# Patient Record
Sex: Male | Born: 1980 | Race: White | Hispanic: No | Marital: Married | Smoking: Former smoker
Health system: Southern US, Community
[De-identification: ages and names within clinical notes are randomized; demographics above are authoritative.]

## PROBLEM LIST (undated history)

## (undated) DIAGNOSIS — N200 Calculus of kidney: Secondary | ICD-10-CM

## (undated) DIAGNOSIS — Z87442 Personal history of urinary calculi: Secondary | ICD-10-CM

## (undated) HISTORY — PX: ANKLE SURGERY: SHX546

## (undated) HISTORY — PX: CYSTOSCOPY WITH HOLMIUM LASER LITHOTRIPSY: SHX6639

---

## 2001-03-16 ENCOUNTER — Encounter: Payer: Self-pay | Admitting: *Deleted

## 2001-03-16 ENCOUNTER — Emergency Department (HOSPITAL_COMMUNITY): Admission: EM | Admit: 2001-03-16 | Discharge: 2001-03-16 | Payer: Self-pay | Admitting: *Deleted

## 2001-03-17 HISTORY — PX: ANKLE SURGERY: SHX546

## 2002-04-09 ENCOUNTER — Emergency Department (HOSPITAL_COMMUNITY): Admission: EM | Admit: 2002-04-09 | Discharge: 2002-04-10 | Payer: Self-pay | Admitting: Internal Medicine

## 2002-04-09 ENCOUNTER — Encounter: Payer: Self-pay | Admitting: Internal Medicine

## 2002-04-10 ENCOUNTER — Encounter: Payer: Self-pay | Admitting: Internal Medicine

## 2002-04-13 ENCOUNTER — Ambulatory Visit (HOSPITAL_COMMUNITY): Admission: RE | Admit: 2002-04-13 | Discharge: 2002-04-13 | Payer: Self-pay | Admitting: Orthopaedic Surgery

## 2002-04-13 ENCOUNTER — Encounter: Payer: Self-pay | Admitting: Orthopaedic Surgery

## 2005-06-22 ENCOUNTER — Emergency Department (HOSPITAL_COMMUNITY): Admission: EM | Admit: 2005-06-22 | Discharge: 2005-06-22 | Payer: Self-pay | Admitting: Emergency Medicine

## 2005-07-02 ENCOUNTER — Emergency Department (HOSPITAL_COMMUNITY): Admission: EM | Admit: 2005-07-02 | Discharge: 2005-07-02 | Payer: Self-pay | Admitting: Emergency Medicine

## 2005-07-03 ENCOUNTER — Ambulatory Visit (HOSPITAL_BASED_OUTPATIENT_CLINIC_OR_DEPARTMENT_OTHER): Admission: RE | Admit: 2005-07-03 | Discharge: 2005-07-03 | Payer: Self-pay | Admitting: Urology

## 2007-11-23 ENCOUNTER — Emergency Department (HOSPITAL_COMMUNITY): Admission: EM | Admit: 2007-11-23 | Discharge: 2007-11-23 | Payer: Self-pay | Admitting: Emergency Medicine

## 2008-12-16 ENCOUNTER — Emergency Department (HOSPITAL_COMMUNITY): Admission: EM | Admit: 2008-12-16 | Discharge: 2008-12-16 | Payer: Self-pay | Admitting: Emergency Medicine

## 2009-03-09 ENCOUNTER — Emergency Department (HOSPITAL_COMMUNITY): Admission: EM | Admit: 2009-03-09 | Discharge: 2009-03-09 | Payer: Self-pay | Admitting: Emergency Medicine

## 2009-05-08 ENCOUNTER — Ambulatory Visit (HOSPITAL_COMMUNITY): Payer: Self-pay | Admitting: Psychiatry

## 2009-06-19 ENCOUNTER — Ambulatory Visit (HOSPITAL_COMMUNITY): Payer: Self-pay | Admitting: Psychiatry

## 2009-06-29 ENCOUNTER — Ambulatory Visit (HOSPITAL_COMMUNITY): Payer: Self-pay | Admitting: Psychology

## 2009-07-04 ENCOUNTER — Ambulatory Visit (HOSPITAL_COMMUNITY): Payer: Self-pay | Admitting: Psychology

## 2009-07-19 ENCOUNTER — Ambulatory Visit (HOSPITAL_COMMUNITY): Payer: Self-pay | Admitting: Psychology

## 2009-08-02 ENCOUNTER — Ambulatory Visit (HOSPITAL_COMMUNITY): Admission: RE | Admit: 2009-08-02 | Discharge: 2009-08-02 | Payer: Self-pay | Admitting: Urology

## 2010-03-17 HISTORY — PX: EXTRACORPOREAL SHOCK WAVE LITHOTRIPSY: SHX1557

## 2010-06-17 LAB — URINALYSIS, ROUTINE W REFLEX MICROSCOPIC
Nitrite: NEGATIVE
Specific Gravity, Urine: 1.005 (ref 1.005–1.030)

## 2010-06-20 LAB — URINALYSIS, ROUTINE W REFLEX MICROSCOPIC
Bilirubin Urine: NEGATIVE
Glucose, UA: NEGATIVE mg/dL
Ketones, ur: NEGATIVE mg/dL
Leukocytes, UA: NEGATIVE
Nitrite: NEGATIVE
Specific Gravity, Urine: 1.02 (ref 1.005–1.030)
Urobilinogen, UA: 0.2 mg/dL (ref 0.0–1.0)
pH: 7 (ref 5.0–8.0)

## 2010-06-20 LAB — URINE MICROSCOPIC-ADD ON

## 2010-08-02 NOTE — Op Note (Signed)
NAME:  Joseph Patel, Joseph Patel                  ACCOUNT NO.:  [id number]   MEDICAL RECORD NO.:  [id number]          PATIENT TYPE:  AMB   LOCATION:  [facility]                         FACILITY:  [facility]   PHYSICIAN:  Ronald L. Earlene Plater, M.D.  DATE OF BIRTH:  11-Mar-1981   DATE OF PROCEDURE:  07/03/2005  DATE OF DISCHARGE:                                 OPERATIVE REPORT   PREOPERATIVE DIAGNOSIS:  Left ureteral lithiasis.   POSTOPERATIVE DIAGNOSIS:  Left ureteral lithiasis.   PROCEDURE:  Left ureteroscopy with holmium laser lithotripsy, basket stone  extraction, and placement of double-J stent.   SURGEON:  Lucrezia Starch. Earlene Plater, M.D.   ANESTHESIA:  LMA.   ESTIMATED BLOOD LOSS:  Negligible.   DRAINS:  26-cm 6 French Contour double pigtail stent.   COMPLICATIONS:  None.   INDICATIONS FOR PROCEDURE:  [name] is a very nice 30 year old white male  who presented with left flank pain and some nausea.  He was seen in the  office and found on CT scan to have approximately a 7-mm left lower ureteral  calculus with hydronephrosis.  He has had recurrent symptoms.  The stone  appears to not be moving.  He has had to go to the emergency room a  secondary time.  After understanding the risks, benefits, and alternatives,  he has elected to proceed with the above procedure.   DESCRIPTION OF PROCEDURE:  The patient was placed in the supine position.  After appropriate LMA anesthesia, he was placed in the dorsal lithotomy  position and prepped and draped in the usual sterile fashion.   Cystourethroscopy was performed with a 22.5 [place] Olympus panendoscope.  Utilizing the 12 and 70-degree lenses, the bladder was carefully inspected.  It was noted to be without lesions.  There was no significant trilobar  hypertrophy, and the orifice was somewhat bulging but did not appear to be  inflamed.  Initial attempts at passing a center wire which was 0.38 [place]  were unsuccessful; therefore, a 6 [place] open-ended catheter was  passed, and  the wire was able to be passed beyond it.  The open-ended catheter was  passed beyond the stone.  Utilizing the wire, the dilating core of a short  ureteral access sheath was placed and dilated the tip just beyond the stone,  but the entire dilator could not be passed past the stone due to the fact  that it was highly impacted.  This was removed.  Ureteroscopy was then  performed with a short thin ureteroscope.  The stone was visualized and  noted to be highly impacted.  Utilizing a 365 Micron laser fiber, it was  broken into multiple fragments and serially extracted with a nitinol basket.  Inspection of the lower third of the ureter revealed that there were no  significant fragments, and no perforations were noted.  The ureteroscope was  removed.  The stone fragments will be submitted for stone analysis.  Under  fluoroscopic guidance, a 26-cm 6 French Contour double pigtail stent was  placed with a pull out string and  noted to be in good position  within the left renal pelvis within the  bladder.  The bladder was drained.  The stent location was confirmed  fluoroscopically.   The patient was taken to the recovery room in stable condition.      Ronald L. Earlene Plater, M.D.  Electronically Signed     RLD/MEDQ  D:  07/03/2005  T:  07/04/2005  Job:  [id number]

## 2010-12-18 LAB — URINALYSIS, ROUTINE W REFLEX MICROSCOPIC

## 2010-12-18 LAB — URINE MICROSCOPIC-ADD ON

## 2011-02-26 ENCOUNTER — Emergency Department (HOSPITAL_COMMUNITY): Payer: No Typology Code available for payment source

## 2011-02-26 ENCOUNTER — Emergency Department (HOSPITAL_COMMUNITY)
Admission: EM | Admit: 2011-02-26 | Discharge: 2011-02-26 | Disposition: A | Discharge status: Home / Self Care | Payer: No Typology Code available for payment source | Attending: Emergency Medicine | Admitting: Emergency Medicine

## 2011-02-26 ENCOUNTER — Encounter: Payer: Self-pay | Admitting: Emergency Medicine

## 2011-02-26 DIAGNOSIS — M25579 Pain in unspecified ankle and joints of unspecified foot: Secondary | ICD-10-CM | POA: Insufficient documentation

## 2011-02-26 DIAGNOSIS — M542 Cervicalgia: Secondary | ICD-10-CM | POA: Insufficient documentation

## 2011-02-26 HISTORY — DX: Calculus of kidney: N20.0
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
## 2011-10-16 ENCOUNTER — Other Ambulatory Visit: Payer: Self-pay | Admitting: Urology

## 2011-10-20 ENCOUNTER — Encounter (HOSPITAL_COMMUNITY): Payer: Self-pay | Admitting: *Deleted

## 2011-10-20 NOTE — Progress Notes (Signed)
1125 Patient was able to repeat back all pre op  instructions for his lithotripsy procedure including arrival time.  His knows not to take Advil, Aleve,Aspirin or Toradol 72 hours prior to his procedure, Bring insurance card,ID, take a laxative and eat a light dinner 10-22-11 by 6 pm.

## 2011-10-22 ENCOUNTER — Encounter (HOSPITAL_COMMUNITY): Payer: Self-pay | Admitting: Pharmacy Technician

## 2011-10-22 DIAGNOSIS — N200 Calculus of kidney: Secondary | ICD-10-CM | POA: Diagnosis present

## 2011-10-22 NOTE — H&P (Signed)
History of Present Illness     Nephrolithiasis: He has a long history of renal calculi. A CT scan in 1/06 revealed multiple bilateral renal stones. In 4/07 he had a 8 mm left ureteral stone treated with ureteroscopy and laser lithotripsy. In 5/11 he underwent ESL of a left UPJ stone. He is intermittently past stones spontaneously. A CT scan done in 7/12 revealed bilateral, nonobstructing renal calculi with 7 x 8 mm stone in the left renal pelvis with Hounsfield units of approximately 850. He reports that on average he passes a stone about every 1-1/2 months. He said his past about 100 in his lifetime. Stone analysis 50% calcium phosphate and 50% calcium oxalate Metabolic analysis: His serum studies were entirely normal. His 24-hour urine revealed saturation with calcium phosphate and uric acid due to a low volume which was found to be only 650 cc/24 hours.  Interval HX: He is asymptomatic today.   Past Medical History Problems  1. History of  Hydronephrosis On The Right 591 2. History of  Nephrolithiasis V13.01 3. History of  Proximal Ureteral Stone On The Left 592.1  Surgical History Problems  1. History of  Ankle Surgery 2. History of  Lithotripsy 3. History of  Lithotripsy  Current Meds 1. Hydrocodone-Acetaminophen 7.5-325 MG Oral Tablet; TAKE 1 TO 2 TABLETS EVERY 4 TO 6  HOURS AS NEEDED FOR PAIN; Therapy: 31Jul2013 to (Evaluate:04Aug2013); Last  Rx:31Jul2013 2. Tamsulosin HCl 0.4 MG Oral Capsule; TAKE 1 CAPSULE BY MOUTH  DAILY; Therapy: 31Jul2013  to (Last Rx:31Jul2013)  Requested for: 31Jul2013 3. Tylenol Extra Strength TABS; Therapy: (Recorded:31Jul2013) to 4. Vicodin TABS; Therapy: (Recorded:31Jul2013) to 5. Zyrtec TABS; Therapy: (Recorded:31Jul2013) to  Allergies Medication  1. No Known Drug Allergies  Family History Problems  1. Family history of  Family Health Status Number Of Children 1 daughter  Social History Problems  1. Caffeine Use occ. cola 2. Marital  History - Currently Married 3. Occupation: [employer] Denied  4. History of  Alcohol Use 5. History of  Tobacco Use  Review of Systems Genitourinary, constitutional, skin, eye, otolaryngeal, hematologic/lymphatic, cardiovascular, pulmonary, endocrine, musculoskeletal, gastrointestinal, neurological and psychiatric system(s) were reviewed and pertinent findings if present are noted.  Genitourinary: hematuria.  Gastrointestinal: flank pain.    Vitals Vital Signs Blood Pressure  Blood Pressure: 130 / 78 Temperature  Temperature: 98.6 F Pulse  Heart Rate: 92  Physical Exam Constitutional: Well nourished and well developed. No acute distress. The patient appears well hydrated.  ENT:. The oropharynx is normal.  Neck: The appearance of the neck is normal and no neck mass is present.  Pulmonary: No respiratory distress and clear bilateral breath sounds.  Cardiovascular: Heart rate and rhythm are normal. The arterial pulses are normal.  Abdomen: The abdomen is flat. The abdomen is soft and nontender. No suprapubic tenderness. No right CVA tenderness and mild left CVA tenderness. Bowel sounds are normal.  Rectal: The prostate exam was deferred.  Skin: Normal skin turgor and normal skin color and pigmentation.  Neuro/Psych:. Mood and affect are appropriate.   Assessment Assessed  1. Nephrolithiasis Of Both Kidneys 592.0       He has stones in both kidneys that are nonobstructing but there is a stone in the renal pelvis on the left-hand side that is likely the cause of his symptoms and we therefore have discussed treatment options. I discussed ureteroscopy, lithotripsy and percutaneous nephrolithotomy. The stone can be visualized on plain film and therefore would be amenable to lithotripsy. He is  most interested in this form of therapy and I therefore went over it with him in detail. We discussed the risks and complications as well as anticipated probability of success. He understands,  I answered all his questions, and he has elected to proceed with lithotripsy.  When he returns following his lithotripsy he needs to undergo a repeat evaluation with a hypercalciuria profile as well as 24-hour urine for stone risk. He then needs to be seen back to go over those results and begin some form of treatment. I told her he would likely need to be on some form of medication and he is more than willing to do that to help prevent further stone formation.   Plan     He will be scheduled for lithotripsy of his left renal pelvic calculus.

## 2011-10-23 ENCOUNTER — Ambulatory Visit (HOSPITAL_COMMUNITY): Payer: 59

## 2011-10-23 ENCOUNTER — Encounter (HOSPITAL_COMMUNITY)
Admission: RE | Disposition: A | Discharge status: Home / Self Care | Payer: Self-pay | Source: Ambulatory Visit | Attending: Urology

## 2011-10-23 ENCOUNTER — Ambulatory Visit (HOSPITAL_COMMUNITY)
Admission: RE | Admit: 2011-10-23 | Discharge: 2011-10-23 | Disposition: A | Discharge status: Home / Self Care | Payer: 59 | Source: Ambulatory Visit | Attending: Urology | Admitting: Urology

## 2011-10-23 ENCOUNTER — Encounter (HOSPITAL_COMMUNITY): Payer: Self-pay | Admitting: *Deleted

## 2011-10-23 DIAGNOSIS — N200 Calculus of kidney: Secondary | ICD-10-CM | POA: Insufficient documentation

## 2011-10-23 SURGERY — LITHOTRIPSY, ESWL
Anesthesia: LOCAL | Laterality: Left

## 2011-10-23 MED ORDER — SODIUM CHLORIDE 0.9 % IV SOLN
INTRAVENOUS | Status: DC
  Administered 2011-10-23: 1000 mL via INTRAVENOUS
  Administered 2011-10-23: 17:00:00 via INTRAVENOUS

## 2011-10-23 MED ORDER — DIPHENHYDRAMINE HCL 25 MG PO CAPS
25.0000 mg | ORAL_CAPSULE | ORAL | Status: AC
  Administered 2011-10-23: 25 mg via ORAL
  Filled 2011-10-23: qty 1

## 2011-10-23 MED ORDER — DIAZEPAM 5 MG PO TABS
10.0000 mg | ORAL_TABLET | ORAL | Status: AC
  Administered 2011-10-23: 10 mg via ORAL
  Filled 2011-10-23: qty 2

## 2011-10-23 MED ORDER — CIPROFLOXACIN HCL 500 MG PO TABS
500.0000 mg | ORAL_TABLET | ORAL | Status: AC
  Administered 2011-10-23: 500 mg via ORAL
  Filled 2011-10-23: qty 1

## 2011-10-23 MED ORDER — HYDROCODONE-ACETAMINOPHEN 7.5-325 MG PO TABS
1.0000 | ORAL_TABLET | Freq: Four times a day (QID) | ORAL | Status: DC | PRN
  Administered 2011-10-23: 1 via ORAL
  Filled 2011-10-23: qty 1

## 2011-10-23 MED ORDER — TAMSULOSIN HCL 0.4 MG PO CAPS
0.4000 mg | ORAL_CAPSULE | Freq: Once | ORAL | Status: DC
  Filled 2011-10-23: qty 1

## 2011-10-23 NOTE — Op Note (Signed)
See [facility] OP note scanned into chart. 

## 2011-10-23 NOTE — Interval H&P Note (Signed)
History and Physical Interval Note:  10/23/2011 3:20 PM  Joseph Patel  has presented today for surgery, with the diagnosis of Left renal Stone  The various methods of treatment have been discussed with the patient and family. After consideration of risks, benefits and other options for treatment, the patient has consented to  Procedure(s) (LRB): EXTRACORPOREAL SHOCK WAVE LITHOTRIPSY (ESWL) (Left) as a surgical intervention .  The patient's history has been reviewed, patient examined, no change in status, stable for surgery.  I have reviewed the patient's chart and labs.  Questions were answered to the patient's satisfaction.     [place]

## 2014-04-05 ENCOUNTER — Other Ambulatory Visit: Payer: Self-pay | Admitting: Urology

## 2014-04-06 ENCOUNTER — Encounter (HOSPITAL_COMMUNITY): Payer: Self-pay | Admitting: *Deleted

## 2014-04-10 ENCOUNTER — Ambulatory Visit (HOSPITAL_COMMUNITY): Payer: 59

## 2014-04-10 ENCOUNTER — Ambulatory Visit (HOSPITAL_COMMUNITY)
Admission: RE | Admit: 2014-04-10 | Discharge: 2014-04-10 | Disposition: A | Discharge status: Home / Self Care | Payer: 59 | Source: Ambulatory Visit | Attending: Urology | Admitting: Urology

## 2014-04-10 ENCOUNTER — Encounter (HOSPITAL_COMMUNITY): Payer: Self-pay | Admitting: *Deleted

## 2014-04-10 ENCOUNTER — Encounter (HOSPITAL_COMMUNITY)
Admission: RE | Disposition: A | Discharge status: Home / Self Care | Payer: Self-pay | Source: Ambulatory Visit | Attending: Urology

## 2014-04-10 DIAGNOSIS — N202 Calculus of kidney with calculus of ureter: Secondary | ICD-10-CM | POA: Insufficient documentation

## 2014-04-10 DIAGNOSIS — N200 Calculus of kidney: Secondary | ICD-10-CM

## 2014-04-10 SURGERY — LITHOTRIPSY, ESWL
Anesthesia: LOCAL | Laterality: Left

## 2014-04-10 MED ORDER — DIPHENHYDRAMINE HCL 25 MG PO CAPS
25.0000 mg | ORAL_CAPSULE | ORAL | Status: AC
  Administered 2014-04-10: 25 mg via ORAL
  Filled 2014-04-10: qty 1

## 2014-04-10 MED ORDER — TAMSULOSIN HCL 0.4 MG PO CAPS: 0.4000 mg | ORAL_CAPSULE | ORAL | Status: DC

## 2014-04-10 MED ORDER — SODIUM CHLORIDE 0.9 % IV SOLN
INTRAVENOUS | Status: DC
  Administered 2014-04-10: 1000 mL via INTRAVENOUS

## 2014-04-10 MED ORDER — DIAZEPAM 5 MG PO TABS
10.0000 mg | ORAL_TABLET | ORAL | Status: AC
Start: 2014-04-10 — End: 2014-04-10
  Administered 2014-04-10: 10 mg via ORAL
  Filled 2014-04-10: qty 2

## 2014-04-10 MED ORDER — OXYCODONE HCL 10 MG PO TABS: 10.0000 mg | ORAL_TABLET | ORAL | Status: DC | PRN

## 2014-04-10 MED ORDER — CIPROFLOXACIN HCL 500 MG PO TABS
500.0000 mg | ORAL_TABLET | ORAL | Status: AC
Start: 2014-04-10 — End: 2014-04-10
  Administered 2014-04-10: 500 mg via ORAL
  Filled 2014-04-10: qty 1

## 2014-04-10 NOTE — Discharge Instructions (Signed)

## 2014-04-10 NOTE — H&P (Signed)
Joseph Patel is a 34 year old male with calculus disease.   History of Present Illness Nephrolithiasis: He has a long history of renal calculi. A CT scan in 1/06 revealed multiple bilateral renal stones.   In 4/07 he had a 8 mm left ureteral stone treated with ureteroscopy and laser lithotripsy.   In 5/11 he underwent ESL of a left UPJ stone. He is intermittently past stones spontaneously.   A CT scan done in 7/12 revealed bilateral, nonobstructing renal calculi with 7 x 8 mm stone in the left renal pelvis with Hounsfield units of approximately 850. He reports that on average he passes a stone about every 1-1/2 months. He said his past about 100 in his lifetime.   He had ESL of a left renal pelvic stone in 8/13 and due to the presence of a stone in the lower pole that was in the shock path this stone was fragmented as well.    Stone analysis: 50% calcium phosphate and 50% calcium oxalate repeat stone analysis revealed composition of 95% calcium oxalate dihydrate and 5% calcium oxalate monohydrate.    Metabolic analysis: His serum studies were entirely normal.   His 24-hour urine in 5/07 revealed saturation with calcium phosphate and uric acid due to a low volume which was found to be only 650 cc/24 hours.  A repeat 24-hour urine study in 10/13 revealed supersaturation of calcium phosphate and calcium oxalate and a low urine volume of 1.69 L. Serum studies were normal.    Interval HX: He experienced left flank pain and was found by CT scan to have a nonobstructing 8.6 mm stone within the left renal pelvis with Hounsfield units of 1300 as well as a 4 mm stone in the left mid ureter. He was started on medical expulsive therapy and he eventually passed that stone. That was in 9/15 and now he tells me that he is been having pain in his left flank that has been associated with nausea but no vomiting. He has not had any hematuria. He did pass a small stone about 3 weeks ago as well.  Review of  Systems Genitourinary, constitutional, skin, eye, otolaryngeal, hematologic/lymphatic, cardiovascular, pulmonary, endocrine, musculoskeletal, gastrointestinal, neurological and psychiatric system(s) were reviewed and pertinent findings if present are noted and are otherwise negative.      Vitals Vital Signs  Height: 5 ft 11 in Weight: 182 lb  BMI Calculated: 25.38 BSA Calculated: 2.03 Blood Pressure: 124 / 84 Heart Rate: 76  Physical Exam Constitutional: Well nourished and well developed . No acute distress.   ENT:. The ears and nose are normal in appearance.   Neck: The appearance of the neck is normal and no neck mass is present.   Pulmonary: No respiratory distress and normal respiratory rhythm and effort.   Cardiovascular: Heart rate and rhythm are normal . No peripheral edema.   Abdomen: The abdomen is soft and nontender. No masses are palpated. No CVA tenderness. No hernias are palpable. No hepatosplenomegaly noted.   Lymphatics: The femoral and inguinal nodes are not enlarged or tender.   Skin: Normal skin turgor, no visible rash and no visible skin lesions.   Neuro/Psych:. Mood and affect are appropriate.       Past Medical History Problems  1. History of kidney stones (Z87.442) 2. History of Hydronephrosis, right (N13.30)  Surgical History Problems  1. History of Ankle Surgery 2. History of Lithotripsy 3. History of Lithotripsy 4. History of Lithotripsy  Current Meds 1. Oxycodone-Acetaminophen 10-325 MG Oral Tablet; TAKE  1 TO 2 TABLETS EVERY 4 TO  6 HOURS AS NEEDED FOR PAIN;  Therapy: 04Aug2015 to (Evaluate:07Aug2015); Last Rx:04Aug2015 Ordered 2. Tamsulosin HCl - 0.4 MG Oral Capsule; Take 1 capsule by mouth at bedtime;  Therapy: 04Aug2015 to (Complete:03Sep2015)  Requested for: 04Aug2015; Last  Rx:04Aug2015 Ordered 3. Tylenol Extra Strength TABS; TAKE TABLET  PRN;  Therapy: (Recorded:04Aug2015) to Recorded  Allergies Medication  1. No Known Drug  Allergies  Family History Problems  1. Family history of Family Health Status Number Of Children   1 daughter 2. No pertinent family history : Mother  Social History Problems  1. Denied: History of Alcohol Use 2. Caffeine Use   occ. cola 3. Marital History - Currently Married 4. Never A Smoker 5. Occupation:   [employer] 6. Denied: History of Tobacco Use      Assessment Assessed    He had been having left flank pain and therefore a KUB was obtained which reveals the stone in the lower pole of his left kidney appears unchanged but there appears to be a second stone that has developed more toward the area renal pelvis but still in the lower pole. There was also a nonobstructing right renal calculus noted which is unchanged.    We first discussed the fact that he has demonstrated metabolic stone activity and is now having pain in the left flank. He has undergone lithotripsy previously and we discussed proceeding with repeat lithotripsy of the stone as it has been successful for him in the past. We discussed the probability of success, the risks and complications, the outpatient nature of the procedure as well as the anticipated postoperative course. He understands and has elected to proceed.    Since he has continued to develop stones I have recommended beginning indapamide. We discussed the mechanism of action as well as the need to avoid a diet high in sodium.   Plan   He will be scheduled for lithotripsy of his left renal calculus.

## 2014-04-10 NOTE — Op Note (Signed)
See [facility] OP note scanned into chart. Also because of the size, density, location and other factors that cannot be anticipated I feel this will likely be a staged procedure. This fact supersedes any indication in the scanned [facility] operative note to the contrary.  

## 2014-05-16 ENCOUNTER — Other Ambulatory Visit (HOSPITAL_COMMUNITY): Payer: Self-pay | Admitting: Physician Assistant

## 2014-05-16 ENCOUNTER — Ambulatory Visit (HOSPITAL_COMMUNITY)
Admission: RE | Admit: 2014-05-16 | Discharge: 2014-05-16 | Disposition: A | Discharge status: Home / Self Care | Payer: 59 | Source: Ambulatory Visit | Attending: Physician Assistant | Admitting: Physician Assistant

## 2014-05-16 DIAGNOSIS — R0781 Pleurodynia: Secondary | ICD-10-CM | POA: Insufficient documentation

## 2014-05-16 DIAGNOSIS — R079 Chest pain, unspecified: Secondary | ICD-10-CM | POA: Diagnosis present

## 2016-04-17 DIAGNOSIS — Z6823 Body mass index (BMI) 23.0-23.9, adult: Secondary | ICD-10-CM | POA: Diagnosis not present

## 2016-04-17 DIAGNOSIS — J069 Acute upper respiratory infection, unspecified: Secondary | ICD-10-CM | POA: Diagnosis not present

## 2016-05-22 DIAGNOSIS — N200 Calculus of kidney: Secondary | ICD-10-CM | POA: Diagnosis not present

## 2016-12-05 DIAGNOSIS — M25572 Pain in left ankle and joints of left foot: Secondary | ICD-10-CM | POA: Diagnosis not present

## 2016-12-05 DIAGNOSIS — M19072 Primary osteoarthritis, left ankle and foot: Secondary | ICD-10-CM | POA: Diagnosis not present

## 2017-05-28 DIAGNOSIS — N200 Calculus of kidney: Secondary | ICD-10-CM | POA: Diagnosis not present

## 2017-05-28 DIAGNOSIS — R6882 Decreased libido: Secondary | ICD-10-CM | POA: Diagnosis not present

## 2018-06-03 DIAGNOSIS — Z6823 Body mass index (BMI) 23.0-23.9, adult: Secondary | ICD-10-CM | POA: Diagnosis not present

## 2018-06-03 DIAGNOSIS — Z0000 Encounter for general adult medical examination without abnormal findings: Secondary | ICD-10-CM | POA: Diagnosis not present

## 2020-05-31 ENCOUNTER — Other Ambulatory Visit: Payer: Self-pay

## 2020-05-31 ENCOUNTER — Inpatient Hospital Stay (HOSPITAL_COMMUNITY)
Admission: EM | Admit: 2020-05-31 | Discharge: 2020-06-01 | DRG: 395 | Disposition: A | Discharge status: Home / Self Care | Payer: 59 | Attending: General Surgery | Admitting: General Surgery

## 2020-05-31 ENCOUNTER — Emergency Department (HOSPITAL_COMMUNITY): Payer: 59

## 2020-05-31 ENCOUNTER — Encounter (HOSPITAL_COMMUNITY): Payer: Self-pay | Admitting: Emergency Medicine

## 2020-05-31 DIAGNOSIS — R55 Syncope and collapse: Secondary | ICD-10-CM

## 2020-05-31 DIAGNOSIS — Y92410 Unspecified street and highway as the place of occurrence of the external cause: Secondary | ICD-10-CM

## 2020-05-31 DIAGNOSIS — K439 Ventral hernia without obstruction or gangrene: Principal | ICD-10-CM | POA: Diagnosis present

## 2020-05-31 DIAGNOSIS — Z87891 Personal history of nicotine dependence: Secondary | ICD-10-CM | POA: Diagnosis not present

## 2020-05-31 DIAGNOSIS — Z20822 Contact with and (suspected) exposure to covid-19: Secondary | ICD-10-CM | POA: Diagnosis present

## 2020-05-31 DIAGNOSIS — S3981XA Other specified injuries of abdomen, initial encounter: Secondary | ICD-10-CM | POA: Diagnosis present

## 2020-05-31 DIAGNOSIS — N200 Calculus of kidney: Secondary | ICD-10-CM | POA: Diagnosis present

## 2020-05-31 DIAGNOSIS — T1490XA Injury, unspecified, initial encounter: Secondary | ICD-10-CM

## 2020-05-31 LAB — I-STAT CHEM 8, ED
BUN: 19 mg/dL (ref 6–20)
Calcium, Ion: 1.1 mmol/L — ABNORMAL LOW (ref 1.15–1.40)
Chloride: 99 mmol/L (ref 98–111)
Creatinine, Ser: 1.1 mg/dL (ref 0.61–1.24)
Glucose, Bld: 171 mg/dL — ABNORMAL HIGH (ref 70–99)
HCT: 47 % (ref 39.0–52.0)
Hemoglobin: 16 g/dL (ref 13.0–17.0)
Potassium: 3.5 mmol/L (ref 3.5–5.1)
Sodium: 138 mmol/L (ref 135–145)
TCO2: 26 mmol/L (ref 22–32)

## 2020-05-31 LAB — RESP PANEL BY RT-PCR (FLU A&B, COVID) ARPGX2
Influenza A by PCR: NEGATIVE
Influenza B by PCR: NEGATIVE
SARS Coronavirus 2 by RT PCR: NEGATIVE

## 2020-05-31 LAB — CBC
HCT: 45.2 % (ref 39.0–52.0)
Hemoglobin: 15.7 g/dL (ref 13.0–17.0)
MCH: 31.9 pg (ref 26.0–34.0)
MCHC: 34.7 g/dL (ref 30.0–36.0)
MCV: 91.9 fL (ref 80.0–100.0)
Platelets: 340 10*3/uL (ref 150–400)
RBC: 4.92 MIL/uL (ref 4.22–5.81)
RDW: 12.9 % (ref 11.5–15.5)
WBC: 10.2 10*3/uL (ref 4.0–10.5)
nRBC: 0 % (ref 0.0–0.2)

## 2020-05-31 LAB — COMPREHENSIVE METABOLIC PANEL
ALT: 29 U/L (ref 0–44)
AST: 23 U/L (ref 15–41)
Albumin: 4.1 g/dL (ref 3.5–5.0)
Alkaline Phosphatase: 69 U/L (ref 38–126)
Anion gap: 10 (ref 5–15)
BUN: 19 mg/dL (ref 6–20)
CO2: 25 mmol/L (ref 22–32)
Calcium: 8.7 mg/dL — ABNORMAL LOW (ref 8.9–10.3)
Chloride: 100 mmol/L (ref 98–111)
Creatinine, Ser: 1.06 mg/dL (ref 0.61–1.24)
GFR, Estimated: >60 mL/min (ref >60)
Glucose, Bld: 172 mg/dL — ABNORMAL HIGH (ref 70–99)
Potassium: 3.4 mmol/L — ABNORMAL LOW (ref 3.5–5.1)
Sodium: 135 mmol/L (ref 135–145)
Total Bilirubin: 0.8 mg/dL (ref 0.3–1.2)
Total Protein: 7.1 g/dL (ref 6.5–8.1)

## 2020-05-31 LAB — SAMPLE TO BLOOD BANK

## 2020-05-31 LAB — ETHANOL: Alcohol, Ethyl (B): <10 mg/dL (ref <10)

## 2020-05-31 MED ORDER — ENOXAPARIN SODIUM 30 MG/0.3ML ~~LOC~~ SOLN
30.0000 mg | Freq: Two times a day (BID) | SUBCUTANEOUS | Status: DC
  Administered 2020-06-01: 30 mg via SUBCUTANEOUS
  Filled 2020-05-31: qty 0.3

## 2020-05-31 MED ORDER — ACETAMINOPHEN 325 MG PO TABS
650.0000 mg | ORAL_TABLET | Freq: Four times a day (QID) | ORAL | Status: DC
  Administered 2020-05-31 – 2020-06-01 (×3): 650 mg via ORAL
  Filled 2020-05-31 (×3): qty 2

## 2020-05-31 MED ORDER — MORPHINE SULFATE (PF) 4 MG/ML IV SOLN
4.0000 mg | INTRAVENOUS | Status: AC | PRN
  Administered 2020-05-31 (×2): 4 mg via INTRAVENOUS
  Filled 2020-05-31 (×2): qty 1

## 2020-05-31 MED ORDER — FENTANYL CITRATE (PF) 100 MCG/2ML IJ SOLN
50.0000 ug | Freq: Once | INTRAMUSCULAR | Status: AC
Start: 2020-05-31 — End: 2020-05-31
  Administered 2020-05-31: 50 ug via INTRAVENOUS
  Filled 2020-05-31: qty 2

## 2020-05-31 MED ORDER — OXYCODONE HCL 5 MG PO TABS: 5.0000 mg | ORAL_TABLET | ORAL | Status: DC | PRN

## 2020-05-31 MED ORDER — METHOCARBAMOL 750 MG PO TABS
750.0000 mg | ORAL_TABLET | Freq: Three times a day (TID) | ORAL | Status: DC | PRN
  Administered 2020-05-31: 750 mg via ORAL
  Filled 2020-05-31: qty 1

## 2020-05-31 MED ORDER — OXYCODONE HCL 5 MG PO TABS
10.0000 mg | ORAL_TABLET | ORAL | Status: DC | PRN
  Administered 2020-05-31 – 2020-06-01 (×2): 10 mg via ORAL
  Filled 2020-05-31 (×2): qty 2

## 2020-05-31 MED ORDER — HYDROMORPHONE HCL 1 MG/ML IJ SOLN
1.0000 mg | INTRAMUSCULAR | Status: DC | PRN
  Administered 2020-05-31 – 2020-06-01 (×3): 1 mg via INTRAVENOUS
  Filled 2020-05-31 (×3): qty 1

## 2020-05-31 MED ORDER — SODIUM CHLORIDE 0.9 % IV SOLN: Freq: Once | INTRAVENOUS | Status: AC

## 2020-05-31 MED ORDER — DOCUSATE SODIUM 100 MG PO CAPS
100.0000 mg | ORAL_CAPSULE | Freq: Two times a day (BID) | ORAL | Status: DC
  Administered 2020-05-31 – 2020-06-01 (×2): 100 mg via ORAL
  Filled 2020-05-31 (×2): qty 1

## 2020-05-31 MED ORDER — ONDANSETRON HCL 4 MG/2ML IJ SOLN
4.0000 mg | Freq: Once | INTRAMUSCULAR | Status: AC | PRN
  Administered 2020-06-01: 4 mg via INTRAVENOUS
  Filled 2020-05-31: qty 2

## 2020-05-31 MED ORDER — POTASSIUM CHLORIDE IN NACL 20-0.9 MEQ/L-% IV SOLN
INTRAVENOUS | Status: DC
  Filled 2020-05-31: qty 1000

## 2020-05-31 MED ORDER — IOHEXOL 300 MG/ML  SOLN
100.0000 mL | Freq: Once | INTRAMUSCULAR | Status: AC | PRN
  Administered 2020-05-31: 100 mL via INTRAVENOUS

## 2020-05-31 MED ORDER — FENTANYL CITRATE (PF) 100 MCG/2ML IJ SOLN
100.0000 ug | Freq: Once | INTRAMUSCULAR | Status: AC | PRN
Start: 2020-05-31 — End: 2020-05-31
  Administered 2020-05-31: 100 ug via INTRAVENOUS
  Filled 2020-05-31: qty 2

## 2020-05-31 MED ORDER — FENTANYL CITRATE (PF) 100 MCG/2ML IJ SOLN
50.0000 ug | INTRAMUSCULAR | Status: DC | PRN
  Administered 2020-05-31: 50 ug via INTRAVENOUS
  Filled 2020-05-31: qty 2

## 2020-05-31 NOTE — ED Notes (Signed)
Attempted to call report to [facility]
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
## 2020-06-01 LAB — HIV ANTIBODY (ROUTINE TESTING W REFLEX): HIV Screen 4th Generation wRfx: NONREACTIVE

## 2020-06-01 MED ORDER — DOCUSATE SODIUM 100 MG PO CAPS: 100.0000 mg | ORAL_CAPSULE | Freq: Two times a day (BID) | ORAL | 0 refills | Status: DC

## 2020-06-01 MED ORDER — POLYETHYLENE GLYCOL 3350 17 G PO PACK: 17.0000 g | PACK | Freq: Two times a day (BID) | ORAL | 0 refills | Status: DC | PRN

## 2020-06-01 MED ORDER — LIDOCAINE 5 % EX PTCH
1.0000 | MEDICATED_PATCH | CUTANEOUS | Status: DC
  Administered 2020-06-01: 1 via TRANSDERMAL
  Filled 2020-06-01: qty 1

## 2020-06-01 MED ORDER — POLYETHYLENE GLYCOL 3350 17 G PO PACK: 17.0000 g | PACK | Freq: Two times a day (BID) | ORAL | Status: DC | PRN

## 2020-06-01 MED ORDER — OXYCODONE HCL 5 MG PO TABS: 5.0000 mg | ORAL_TABLET | Freq: Four times a day (QID) | ORAL | 0 refills | Status: DC | PRN

## 2020-06-01 MED ORDER — METHOCARBAMOL 500 MG PO TABS: 500.0000 mg | ORAL_TABLET | Freq: Three times a day (TID) | ORAL | 0 refills | Status: DC | PRN

## 2020-06-01 MED ORDER — OXYCODONE HCL 5 MG PO TABS
5.0000 mg | ORAL_TABLET | ORAL | Status: DC | PRN
  Administered 2020-06-01: 10 mg via ORAL
  Filled 2020-06-01: qty 2

## 2020-06-01 MED ORDER — METHOCARBAMOL 500 MG PO TABS
500.0000 mg | ORAL_TABLET | Freq: Three times a day (TID) | ORAL | Status: DC
  Administered 2020-06-01: 500 mg via ORAL
  Filled 2020-06-01: qty 1

## 2020-06-01 NOTE — Plan of Care (Signed)
  Problem: Education: Goal: Knowledge of General Education information will improve Description Including pain rating scale, medication(s)/side effects and non-pharmacologic comfort measures Outcome: Progressing   
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
## 2020-06-01 NOTE — Plan of Care (Signed)

## 2020-06-01 NOTE — Progress Notes (Signed)
OT Cancellation Note  Patient Details Name: Joseph Patel MRN: [id number] DOB: November 29, 1980   Cancelled Treatment:    Reason Eval/Treat Not Completed: Pain limiting ability to participate. RN to administer pain meds at this time. OT to check back as time allows.   Kallie Edward OTR/L Supplemental OT, Department of rehab services [phone]  Maddox Hlavaty R H. 06/01/2020, 10:10 AM
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
## 2020-06-01 NOTE — Progress Notes (Signed)
Orthopedic Tech Progress Note Patient Details:  CATALDO COSGRIFF April 20, 1980 [id number] Patient has splint Patient ID: HANCE CASPERS, male   DOB: 01-28-1981, 40 y.o.   MRN: [id number]   Lovett Calender 06/01/2020, 4:39 PM
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
## 2020-10-15 ENCOUNTER — Ambulatory Visit: Payer: Self-pay | Admitting: Surgery

## 2020-10-15 NOTE — Progress Notes (Signed)
Sent message, via epic in basket, requesting orders in epic from surgeon.  

## 2020-10-30 NOTE — Patient Instructions (Addendum)
DUE TO COVID-19 ONLY ONE VISITOR IS ALLOWED TO COME WITH YOU AND STAY IN THE WAITING ROOM ONLY DURING PRE OP AND PROCEDURE DAY OF SURGERY. THE 2 VISITORS  MAY VISIT WITH YOU AFTER SURGERY IN YOUR PRIVATE ROOM DURING VISITING HOURS ONLY!  YOU NEED TO HAVE A COVID 19 TEST ON__8/18_____THIS TEST MUST BE DONE BEFORE SURGERY,                 Joseph Patel     Your procedure is scheduled on: 11/05/20   Report to [facility] Main  Entrance   Report to admitting at 12:00 PM     Call this number if you have problems the morning of surgery [phone]    Remember: Do not eat food after Midnight.    You may have clear liquids until 11:00 am  CLEAR LIQUID DIET   Foods Allowed                                                                     Foods Excluded  Coffee and tea, regular and decaf                             liquids that you cannot  Plain Jell-O any favor except red or purple                                           see through such as: Fruit ices (not with fruit pulp)                                     milk, soups, orange juice  Iced Popsicles                                    All solid food Carbonated beverages, regular and diet                                    Cranberry, grape and apple juices Sports drinks like Gatorade Lightly seasoned clear broth or consume(fat free) Sugar, honey syrup     BRUSH YOUR TEETH MORNING OF SURGERY AND RINSE YOUR MOUTH OUT, NO CHEWING GUM CANDY OR MINTS.     Take these medicines the morning of surgery with A SIP OF WATER: none                               You may not have any metal on your body including              piercings  Do not wear jewelry, lotions, powders or deodorant                          Men may shave face and neck.   Do not bring  valuables to the hospital. [facility] IS NOT             RESPONSIBLE   FOR VALUABLES.  Contacts, dentures or bridgework may not be worn into surgery.                   [facility] - Preparing for Surgery Before surgery, you can play an important role.  Because skin is not sterile, your skin needs to be as free of germs as possible.  You can reduce the number of germs on your skin by washing with CHG (chlorahexidine gluconate) soap before surgery.  CHG is an antiseptic cleaner which kills germs and bonds with the skin to continue killing germs even after washing. Please DO NOT use if you have an allergy to CHG or antibacterial soaps.  If your skin becomes reddened/irritated stop using the CHG and inform your nurse when you arrive at Short Stay. You may shave your face/neck. Please follow these instructions carefully:  1.  Shower with CHG Soap the night before surgery and the  morning of Surgery.  2.  If you choose to wash your hair, wash your hair first as usual with your  normal  shampoo.  3.  After you shampoo, rinse your hair and body thoroughly to remove the  shampoo.                                        4.  Use CHG as you would any other liquid soap.  You can apply chg directly  to the skin and wash                       Gently with a scrungie or clean washcloth.  5.  Apply the CHG Soap to your body ONLY FROM THE NECK DOWN.   Do not use on face/ open                           Wound or open sores. Avoid contact with eyes, ears mouth and genitals (private parts).                       Wash face,  Genitals (private parts) with your normal soap.             6.  Wash thoroughly, paying special attention to the area where your surgery  will be performed.  7.  Thoroughly rinse your body with warm water from the neck down.  8.  DO NOT shower/wash with your normal soap after using and rinsing off  the CHG Soap.                9.  Pat yourself dry with a clean towel.            10.  Wear clean pajamas.            11.  Place clean sheets on your bed the night of your first shower and do not  sleep with pets. Day of Surgery : Do not apply any lotions/deodorants the  morning of surgery.  Please wear clean clothes to the hospital/surgery center.  FAILURE TO FOLLOW THESE INSTRUCTIONS MAY RESULT IN THE CANCELLATION OF YOUR SURGERY PATIENT SIGNATURE_________________________________  NURSE SIGNATURE__________________________________  ________________________________________________________________________

## 2020-10-31 ENCOUNTER — Other Ambulatory Visit (HOSPITAL_COMMUNITY): Payer: Self-pay

## 2020-11-01 ENCOUNTER — Encounter (HOSPITAL_COMMUNITY): Payer: Self-pay

## 2020-11-01 ENCOUNTER — Encounter (HOSPITAL_COMMUNITY)
Admission: RE | Admit: 2020-11-01 | Discharge: 2020-11-01 | Disposition: A | Discharge status: Home / Self Care | Payer: 59 | Source: Ambulatory Visit | Attending: Surgery | Admitting: Surgery

## 2020-11-01 ENCOUNTER — Other Ambulatory Visit: Payer: Self-pay | Admitting: Surgery

## 2020-11-01 ENCOUNTER — Other Ambulatory Visit: Payer: Self-pay

## 2020-11-01 DIAGNOSIS — Z01812 Encounter for preprocedural laboratory examination: Secondary | ICD-10-CM | POA: Insufficient documentation

## 2020-11-01 HISTORY — DX: Personal history of urinary calculi: Z87.442

## 2020-11-01 LAB — CBC
HCT: 46.1 % (ref 39.0–52.0)
Hemoglobin: 16.1 g/dL (ref 13.0–17.0)
MCH: 31.2 pg (ref 26.0–34.0)
MCHC: 34.9 g/dL (ref 30.0–36.0)
MCV: 89.3 fL (ref 80.0–100.0)
Platelets: 259 10*3/uL (ref 150–400)
RBC: 5.16 MIL/uL (ref 4.22–5.81)
RDW: 12.8 % (ref 11.5–15.5)
WBC: 5.9 10*3/uL (ref 4.0–10.5)
nRBC: 0 % (ref 0.0–0.2)

## 2020-11-01 LAB — BASIC METABOLIC PANEL
Anion gap: 9 (ref 5–15)
BUN: 13 mg/dL (ref 6–20)
CO2: 26 mmol/L (ref 22–32)
Calcium: 9.3 mg/dL (ref 8.9–10.3)
Chloride: 102 mmol/L (ref 98–111)
Creatinine, Ser: 1.01 mg/dL (ref 0.61–1.24)
GFR, Estimated: >60 mL/min (ref >60)
Glucose, Bld: 97 mg/dL (ref 70–99)
Potassium: 3.6 mmol/L (ref 3.5–5.1)
Sodium: 137 mmol/L (ref 135–145)

## 2020-11-01 LAB — SARS CORONAVIRUS 2 (TAT 6-24 HRS): SARS Coronavirus 2: NEGATIVE

## 2020-11-01 MED ORDER — EPINEPHRINE PF 1 MG/ML IJ SOLN
INTRAMUSCULAR | Status: AC
  Filled 2020-11-01: qty 1

## 2020-11-01 NOTE — Progress Notes (Signed)
COVID Vaccine Completed:no  Date COVID test 8/18    PCP - Dr B. Mann PA   Chest x-ray - no EKG - 05/31/20 -epic Stress Test - no ECHO - no Cardiac Cath - no Pacemaker/ICD device last checked:NA  Sleep Study - no CPAP -   Fasting Blood Sugar - NA Checks Blood Sugar _____ times a day  Blood Thinner Instructions:NA Aspirin Instructions: Last Dose:  Anesthesia review: no  Patient denies shortness of breath, fever, cough and chest pain at PAT appointment No SOB with any activities.  Patient verbalized understanding of instructions that were given to them at the PAT appointment. Patient was also instructed that they will need to review over the PAT instructions again at home before surgery. yes

## 2020-11-04 MED ORDER — BUPIVACAINE LIPOSOME 1.3 % IJ SUSP
20.0000 mL | Freq: Once | INTRAMUSCULAR | Status: DC
  Filled 2020-11-04: qty 20

## 2020-11-04 NOTE — Anesthesia Preprocedure Evaluation (Addendum)
Anesthesia Evaluation  Patient identified by MRN, date of birth, ID band Patient awake    Reviewed: Allergy & Precautions, NPO status , Patient's Chart, lab work & pertinent test results  Airway Mallampati: II  TM Distance: >3 FB Neck ROM: Full    Dental no notable dental hx. (+) Teeth Intact, Dental Advisory Given   Pulmonary former smoker,    Pulmonary exam normal breath sounds clear to auscultation       Cardiovascular Exercise Tolerance: Good Normal cardiovascular exam Rhythm:Regular Rate:Normal     Neuro/Psych negative neurological ROS  negative psych ROS   GI/Hepatic negative GI ROS, Neg liver ROS,   Endo/Other  negative endocrine ROS  Renal/GU Lab Results      Component                Value               Date                      CREATININE               1.01                11/01/2020                BUN                      13                  11/01/2020                NA                       137                 11/01/2020                K                        3.6                 11/01/2020                CL                       102                 11/01/2020                CO2                      26                  11/01/2020                Musculoskeletal negative musculoskeletal ROS (+)   Abdominal   Peds  Hematology Lab Results      Component                Value               Date                      WBC  5.9                 11/01/2020                HGB                      16.1                11/01/2020                HCT                      46.1                11/01/2020                MCV                      89.3                11/01/2020                PLT                      259                 11/01/2020              Anesthesia Other Findings   Reproductive/Obstetrics                            Anesthesia Physical Anesthesia Plan  ASA:  1  Anesthesia Plan: General   Post-op Pain Management:    Induction: Intravenous  PONV Risk Score and Plan: 3 and Treatment may vary due to age or medical condition, Ondansetron, Midazolam and Dexamethasone  Airway Management Planned: Oral ETT  Additional Equipment: None  Intra-op Plan:   Post-operative Plan: Extubation in OR  Informed Consent: I have reviewed the patients History and Physical, chart, labs and discussed the procedure including the risks, benefits and alternatives for the proposed anesthesia with the patient or authorized representative who has indicated his/her understanding and acceptance.     Dental advisory given  Plan Discussed with: CRNA and Anesthesiologist  Anesthesia Plan Comments:        Anesthesia Quick Evaluation

## 2020-11-05 ENCOUNTER — Inpatient Hospital Stay (HOSPITAL_COMMUNITY): Payer: 59 | Admitting: Anesthesiology

## 2020-11-05 ENCOUNTER — Encounter (HOSPITAL_COMMUNITY): Payer: Self-pay | Admitting: Surgery

## 2020-11-05 ENCOUNTER — Encounter (HOSPITAL_COMMUNITY)
Admission: RE | Disposition: A | Discharge status: Home / Self Care | Payer: Self-pay | Source: Home / Self Care | Attending: Surgery

## 2020-11-05 ENCOUNTER — Inpatient Hospital Stay (HOSPITAL_COMMUNITY)
Admission: RE | Admit: 2020-11-05 | Discharge: 2020-11-06 | DRG: 337 | Disposition: A | Discharge status: Home / Self Care | Payer: 59 | Attending: Surgery | Admitting: Surgery

## 2020-11-05 ENCOUNTER — Other Ambulatory Visit: Payer: Self-pay

## 2020-11-05 DIAGNOSIS — Z87891 Personal history of nicotine dependence: Secondary | ICD-10-CM | POA: Diagnosis not present

## 2020-11-05 DIAGNOSIS — K660 Peritoneal adhesions (postprocedural) (postinfection): Secondary | ICD-10-CM | POA: Diagnosis present

## 2020-11-05 DIAGNOSIS — Z87442 Personal history of urinary calculi: Secondary | ICD-10-CM

## 2020-11-05 DIAGNOSIS — K439 Ventral hernia without obstruction or gangrene: Secondary | ICD-10-CM | POA: Diagnosis present

## 2020-11-05 DIAGNOSIS — Z79899 Other long term (current) drug therapy: Secondary | ICD-10-CM | POA: Diagnosis not present

## 2020-11-05 HISTORY — PX: VENTRAL HERNIA REPAIR: SHX424

## 2020-11-05 LAB — CBC
HCT: 42.4 % (ref 39.0–52.0)
Hemoglobin: 14.6 g/dL (ref 13.0–17.0)
MCH: 31.3 pg (ref 26.0–34.0)
MCHC: 34.4 g/dL (ref 30.0–36.0)
MCV: 91 fL (ref 80.0–100.0)
Platelets: 224 10*3/uL (ref 150–400)
RBC: 4.66 MIL/uL (ref 4.22–5.81)
RDW: 12.7 % (ref 11.5–15.5)
WBC: 11.3 10*3/uL — ABNORMAL HIGH (ref 4.0–10.5)
nRBC: 0 % (ref 0.0–0.2)

## 2020-11-05 LAB — CREATININE, SERUM
Creatinine, Ser: 1 mg/dL (ref 0.61–1.24)
GFR, Estimated: >60 mL/min (ref >60)

## 2020-11-05 SURGERY — REPAIR, HERNIA, VENTRAL
Anesthesia: General | Site: Abdomen | Laterality: Left

## 2020-11-05 MED ORDER — PROPOFOL 10 MG/ML IV BOLUS
INTRAVENOUS | Status: AC
  Filled 2020-11-05: qty 20

## 2020-11-05 MED ORDER — 0.9 % SODIUM CHLORIDE (POUR BTL) OPTIME
TOPICAL | Status: DC | PRN
  Administered 2020-11-05: 1000 mL

## 2020-11-05 MED ORDER — AMISULPRIDE (ANTIEMETIC) 5 MG/2ML IV SOLN: 10.0000 mg | Freq: Once | INTRAVENOUS | Status: DC | PRN

## 2020-11-05 MED ORDER — CHLORHEXIDINE GLUCONATE 0.12 % MT SOLN
15.0000 mL | Freq: Once | OROMUCOSAL | Status: AC
  Administered 2020-11-05: 15 mL via OROMUCOSAL

## 2020-11-05 MED ORDER — GABAPENTIN 300 MG PO CAPS
300.0000 mg | ORAL_CAPSULE | ORAL | Status: AC
  Administered 2020-11-05: 300 mg via ORAL
  Filled 2020-11-05: qty 1

## 2020-11-05 MED ORDER — SIMETHICONE 80 MG PO CHEW: 80.0000 mg | CHEWABLE_TABLET | Freq: Four times a day (QID) | ORAL | Status: DC | PRN

## 2020-11-05 MED ORDER — ONDANSETRON HCL 4 MG/2ML IJ SOLN: 4.0000 mg | Freq: Once | INTRAMUSCULAR | Status: DC | PRN

## 2020-11-05 MED ORDER — HYDROMORPHONE HCL 1 MG/ML IJ SOLN: 1.0000 mg | INTRAMUSCULAR | Status: DC | PRN

## 2020-11-05 MED ORDER — FENTANYL CITRATE (PF) 250 MCG/5ML IJ SOLN
INTRAMUSCULAR | Status: DC | PRN
  Administered 2020-11-05 (×3): 50 ug via INTRAVENOUS
  Administered 2020-11-05: 100 ug via INTRAVENOUS

## 2020-11-05 MED ORDER — KETAMINE HCL 10 MG/ML IJ SOLN
INTRAMUSCULAR | Status: DC | PRN
  Administered 2020-11-05 (×2): 20 mg via INTRAVENOUS

## 2020-11-05 MED ORDER — ONDANSETRON HCL 4 MG/2ML IJ SOLN
INTRAMUSCULAR | Status: AC
  Filled 2020-11-05: qty 2

## 2020-11-05 MED ORDER — LIDOCAINE 2% (20 MG/ML) 5 ML SYRINGE
INTRAMUSCULAR | Status: DC | PRN
  Administered 2020-11-05: 1.5 mg/kg/h via INTRAVENOUS

## 2020-11-05 MED ORDER — ONDANSETRON HCL 4 MG/2ML IJ SOLN
INTRAMUSCULAR | Status: DC | PRN
  Administered 2020-11-05: 4 mg via INTRAVENOUS

## 2020-11-05 MED ORDER — OXYCODONE HCL 5 MG PO TABS
10.0000 mg | ORAL_TABLET | ORAL | Status: DC | PRN
Start: 2020-11-05 — End: 2020-11-06
  Administered 2020-11-05: 10 mg via ORAL
  Filled 2020-11-05: qty 2

## 2020-11-05 MED ORDER — CHLORHEXIDINE GLUCONATE CLOTH 2 % EX PADS
6.0000 | MEDICATED_PAD | Freq: Once | CUTANEOUS | Status: AC
  Administered 2020-11-05: 6 via TOPICAL

## 2020-11-05 MED ORDER — ONDANSETRON HCL 4 MG/2ML IJ SOLN: 4.0000 mg | Freq: Four times a day (QID) | INTRAMUSCULAR | Status: DC | PRN

## 2020-11-05 MED ORDER — LIDOCAINE 2% (20 MG/ML) 5 ML SYRINGE
INTRAMUSCULAR | Status: AC
  Filled 2020-11-05: qty 5

## 2020-11-05 MED ORDER — ACETAMINOPHEN 325 MG PO TABS
650.0000 mg | ORAL_TABLET | Freq: Four times a day (QID) | ORAL | Status: DC
  Administered 2020-11-05 – 2020-11-06 (×3): 650 mg via ORAL
  Filled 2020-11-05 (×3): qty 2

## 2020-11-05 MED ORDER — DOCUSATE SODIUM 100 MG PO CAPS
100.0000 mg | ORAL_CAPSULE | Freq: Two times a day (BID) | ORAL | Status: DC
  Administered 2020-11-05 – 2020-11-06 (×2): 100 mg via ORAL
  Filled 2020-11-05 (×2): qty 1

## 2020-11-05 MED ORDER — GABAPENTIN 300 MG PO CAPS
300.0000 mg | ORAL_CAPSULE | Freq: Three times a day (TID) | ORAL | Status: DC
  Administered 2020-11-05 – 2020-11-06 (×2): 300 mg via ORAL
  Filled 2020-11-05 (×2): qty 1

## 2020-11-05 MED ORDER — OXYCODONE HCL 5 MG PO TABS
5.0000 mg | ORAL_TABLET | ORAL | Status: DC | PRN
Start: 2020-11-05 — End: 2020-11-06

## 2020-11-05 MED ORDER — MIDAZOLAM HCL 2 MG/2ML IJ SOLN
INTRAMUSCULAR | Status: AC
  Filled 2020-11-05: qty 2

## 2020-11-05 MED ORDER — DEXAMETHASONE SODIUM PHOSPHATE 10 MG/ML IJ SOLN
INTRAMUSCULAR | Status: DC | PRN
  Administered 2020-11-05: 10 mg via INTRAVENOUS

## 2020-11-05 MED ORDER — SUGAMMADEX SODIUM 200 MG/2ML IV SOLN
INTRAVENOUS | Status: DC | PRN
  Administered 2020-11-05: 175 mg via INTRAVENOUS

## 2020-11-05 MED ORDER — FENTANYL CITRATE (PF) 250 MCG/5ML IJ SOLN
INTRAMUSCULAR | Status: AC
  Filled 2020-11-05: qty 5

## 2020-11-05 MED ORDER — BUPIVACAINE-EPINEPHRINE (PF) 0.25% -1:200000 IJ SOLN
INTRAMUSCULAR | Status: DC | PRN
  Administered 2020-11-05: 30 mL

## 2020-11-05 MED ORDER — LACTATED RINGERS IV SOLN: INTRAVENOUS | Status: DC

## 2020-11-05 MED ORDER — KETOROLAC TROMETHAMINE 30 MG/ML IJ SOLN
30.0000 mg | Freq: Once | INTRAMUSCULAR | Status: AC | PRN
  Administered 2020-11-05: 30 mg via INTRAVENOUS

## 2020-11-05 MED ORDER — PROCHLORPERAZINE EDISYLATE 10 MG/2ML IJ SOLN
10.0000 mg | INTRAMUSCULAR | Status: DC | PRN
  Administered 2020-11-05: 10 mg via INTRAVENOUS
  Filled 2020-11-05: qty 2

## 2020-11-05 MED ORDER — OXYCODONE HCL 5 MG PO TABS: 5.0000 mg | ORAL_TABLET | Freq: Once | ORAL | Status: DC | PRN

## 2020-11-05 MED ORDER — KETOROLAC TROMETHAMINE 15 MG/ML IJ SOLN
15.0000 mg | Freq: Three times a day (TID) | INTRAMUSCULAR | Status: DC
  Administered 2020-11-05 – 2020-11-06 (×2): 15 mg via INTRAVENOUS
  Filled 2020-11-05 (×2): qty 1

## 2020-11-05 MED ORDER — BUPIVACAINE LIPOSOME 1.3 % IJ SUSP
INTRAMUSCULAR | Status: DC | PRN
  Administered 2020-11-05: 20 mL

## 2020-11-05 MED ORDER — METHOCARBAMOL 500 MG IVPB - SIMPLE MED
500.0000 mg | Freq: Four times a day (QID) | INTRAVENOUS | Status: DC | PRN
  Filled 2020-11-05: qty 50

## 2020-11-05 MED ORDER — ROCURONIUM BROMIDE 10 MG/ML (PF) SYRINGE
PREFILLED_SYRINGE | INTRAVENOUS | Status: DC | PRN
  Administered 2020-11-05: 60 mg via INTRAVENOUS

## 2020-11-05 MED ORDER — BUPIVACAINE-EPINEPHRINE (PF) 0.25% -1:200000 IJ SOLN
INTRAMUSCULAR | Status: AC
  Filled 2020-11-05: qty 30

## 2020-11-05 MED ORDER — ACETAMINOPHEN 500 MG PO TABS
1000.0000 mg | ORAL_TABLET | ORAL | Status: AC
  Administered 2020-11-05: 1000 mg via ORAL
  Filled 2020-11-05: qty 2

## 2020-11-05 MED ORDER — OXYCODONE HCL 5 MG/5ML PO SOLN: 5.0000 mg | Freq: Once | ORAL | Status: DC | PRN

## 2020-11-05 MED ORDER — CEFAZOLIN SODIUM-DEXTROSE 2-4 GM/100ML-% IV SOLN
2.0000 g | INTRAVENOUS | Status: AC
  Administered 2020-11-05: 2 g via INTRAVENOUS
  Filled 2020-11-05: qty 100

## 2020-11-05 MED ORDER — MIDAZOLAM HCL 5 MG/5ML IJ SOLN
INTRAMUSCULAR | Status: DC | PRN
  Administered 2020-11-05: 2 mg via INTRAVENOUS

## 2020-11-05 MED ORDER — ENOXAPARIN SODIUM 40 MG/0.4ML IJ SOSY
40.0000 mg | PREFILLED_SYRINGE | INTRAMUSCULAR | Status: DC
  Administered 2020-11-06: 40 mg via SUBCUTANEOUS
  Filled 2020-11-05: qty 0.4

## 2020-11-05 MED ORDER — KETOROLAC TROMETHAMINE 30 MG/ML IJ SOLN
INTRAMUSCULAR | Status: AC
  Filled 2020-11-05: qty 1

## 2020-11-05 MED ORDER — ORAL CARE MOUTH RINSE: 15.0000 mL | Freq: Once | OROMUCOSAL | Status: AC

## 2020-11-05 MED ORDER — KETAMINE HCL 10 MG/ML IJ SOLN
INTRAMUSCULAR | Status: AC
  Filled 2020-11-05: qty 1

## 2020-11-05 MED ORDER — HYDROMORPHONE HCL 1 MG/ML IJ SOLN
INTRAMUSCULAR | Status: AC
  Filled 2020-11-05: qty 1

## 2020-11-05 MED ORDER — DEXAMETHASONE SODIUM PHOSPHATE 10 MG/ML IJ SOLN
INTRAMUSCULAR | Status: AC
  Filled 2020-11-05: qty 1

## 2020-11-05 MED ORDER — INDAPAMIDE 1.25 MG PO TABS
2.5000 mg | ORAL_TABLET | Freq: Every day | ORAL | Status: DC
  Administered 2020-11-06: 2.5 mg via ORAL
  Filled 2020-11-05: qty 2

## 2020-11-05 MED ORDER — GABAPENTIN 600 MG PO TABS
300.0000 mg | ORAL_TABLET | Freq: Three times a day (TID) | ORAL | Status: DC
  Filled 2020-11-05 (×2): qty 0.5

## 2020-11-05 MED ORDER — PROPOFOL 10 MG/ML IV BOLUS
INTRAVENOUS | Status: DC | PRN
  Administered 2020-11-05: 180 mg via INTRAVENOUS

## 2020-11-05 MED ORDER — EPHEDRINE SULFATE-NACL 50-0.9 MG/10ML-% IV SOSY
PREFILLED_SYRINGE | INTRAVENOUS | Status: DC | PRN
  Administered 2020-11-05: 10 mg via INTRAVENOUS

## 2020-11-05 MED ORDER — HYDROMORPHONE HCL 1 MG/ML IJ SOLN
0.2500 mg | INTRAMUSCULAR | Status: DC | PRN
  Administered 2020-11-05 (×4): 0.5 mg via INTRAVENOUS

## 2020-11-05 MED ORDER — CELECOXIB 200 MG PO CAPS
400.0000 mg | ORAL_CAPSULE | ORAL | Status: AC
  Administered 2020-11-05: 400 mg via ORAL
  Filled 2020-11-05: qty 2

## 2020-11-05 SURGICAL SUPPLY — 49 items
ADH SKN CLS APL DERMABOND .7 (GAUZE/BANDAGES/DRESSINGS) ×1
APL SKNCLS STERI-STRIP NONHPOA (GAUZE/BANDAGES/DRESSINGS)
BAG COUNTER SPONGE SURGICOUNT (BAG) IMPLANT
BAG SPNG CNTER NS LX DISP (BAG)
BENZOIN TINCTURE PRP APPL 2/3 (GAUZE/BANDAGES/DRESSINGS) IMPLANT
COVER SURGICAL LIGHT HANDLE (MISCELLANEOUS) ×2 IMPLANT
DECANTER SPIKE VIAL GLASS SM (MISCELLANEOUS) ×2 IMPLANT
DERMABOND ADVANCED (GAUZE/BANDAGES/DRESSINGS) ×1
DERMABOND ADVANCED .7 DNX12 (GAUZE/BANDAGES/DRESSINGS) ×1 IMPLANT
DRAIN CHANNEL 19F RND (DRAIN) IMPLANT
DRAPE LAPAROTOMY T 98X78 PEDS (DRAPES) IMPLANT
ELECT REM PT RETURN 15FT ADLT (MISCELLANEOUS) ×2 IMPLANT
EVACUATOR SILICONE 100CC (DRAIN) IMPLANT
GAUZE SPONGE 4X4 12PLY STRL (GAUZE/BANDAGES/DRESSINGS) IMPLANT
GLOVE SRG 8 PF TXTR STRL LF DI (GLOVE) ×1 IMPLANT
GLOVE SURG ENC MOIS LTX SZ7 (GLOVE) ×2 IMPLANT
GLOVE SURG POLY ORTHO LF SZ7.5 (GLOVE) ×2 IMPLANT
GLOVE SURG UNDER POLY LF SZ8 (GLOVE) ×2
GOWN STRL REUS W/TWL XL LVL3 (GOWN DISPOSABLE) ×4 IMPLANT
KIT BASIN OR (CUSTOM PROCEDURE TRAY) ×2 IMPLANT
KIT TURNOVER KIT A (KITS) ×2 IMPLANT
MESH BARD SOFT 6X6IN (Mesh General) ×2 IMPLANT
NEEDLE HYPO 22GX1.5 SAFETY (NEEDLE) ×1 IMPLANT
NS IRRIG 1000ML POUR BTL (IV SOLUTION) ×2 IMPLANT
PACK GENERAL/GYN (CUSTOM PROCEDURE TRAY) ×2 IMPLANT
PENCIL SMOKE EVACUATOR (MISCELLANEOUS) IMPLANT
SPONGE T-LAP 18X18 ~~LOC~~+RFID (SPONGE) IMPLANT
STAPLER VISISTAT 35W (STAPLE) IMPLANT
STRIP CLOSURE SKIN 1/2X4 (GAUZE/BANDAGES/DRESSINGS) IMPLANT
SUT ETHILON 2 0 PS N (SUTURE) IMPLANT
SUT MNCRL AB 4-0 PS2 18 (SUTURE) ×2 IMPLANT
SUT NOVA 0 T19/GS 22DT (SUTURE) IMPLANT
SUT NOVA 1 T20/GS 25DT (SUTURE) ×1 IMPLANT
SUT NOVA NAB DX-16 0-1 5-0 T12 (SUTURE) IMPLANT
SUT PDS AB 1 TP1 96 (SUTURE) IMPLANT
SUT PROLENE 0 CT 1 CR/8 (SUTURE) IMPLANT
SUT STRAFIX SYMMETRIC 0-0 24 (SUTURE) ×2
SUT VIC AB 2-0 SH 27 (SUTURE)
SUT VIC AB 2-0 SH 27X BRD (SUTURE) IMPLANT
SUT VIC AB 3-0 SH 18 (SUTURE) ×2 IMPLANT
SUT VIC AB 3-0 SH 27 (SUTURE)
SUT VIC AB 3-0 SH 27X BRD (SUTURE) IMPLANT
SUT VIC AB 3-0 SH 8-18 (SUTURE) ×2 IMPLANT
SUTURE STRAFIX SYMMETRC 0-0 24 (SUTURE) ×1 IMPLANT
SYR CONTROL 10ML LL (SYRINGE) ×2 IMPLANT
TOWEL OR 17X26 10 PK STRL BLUE (TOWEL DISPOSABLE) ×2 IMPLANT
TOWEL OR NON WOVEN STRL DISP B (DISPOSABLE) ×2 IMPLANT
TRAY FOLEY MTR SLVR 14FR STAT (SET/KITS/TRAYS/PACK) IMPLANT
TRAY FOLEY MTR SLVR 16FR STAT (SET/KITS/TRAYS/PACK) IMPLANT

## 2020-11-05 NOTE — Anesthesia Postprocedure Evaluation (Signed)
Anesthesia Post Note  Patient: Joseph Patel  Procedure(s) Performed: LEFT TRAUMATIC HERNIA REPAIR WITH MESH (Left: Abdomen)     Patient location during evaluation: PACU Anesthesia Type: General Level of consciousness: awake and alert Pain management: pain level controlled Vital Signs Assessment: post-procedure vital signs reviewed and stable Respiratory status: spontaneous breathing, nonlabored ventilation, respiratory function stable and patient connected to nasal cannula oxygen Cardiovascular status: blood pressure returned to baseline and stable Postop Assessment: no apparent nausea or vomiting Anesthetic complications: no   No notable events documented.  Last Vitals:  Vitals:   11/05/20 1540 11/05/20 1545  BP: 137/80 126/80  Pulse: 100 92  Resp: 18 13  Temp: 36.6 C   SpO2: 100% 100%    Last Pain:  Vitals:   11/05/20 1557  TempSrc:   PainSc: 8                  Trevor Iha

## 2020-11-05 NOTE — H&P (Signed)
Admitting Physician: Hyman Hopes Nomie Buchberger  Service: General surgery  CC: Traumatic abdominal wall hernia  Subjective   HPI: Joseph Patel is an 40 y.o. male who is here for elective open left traumatic abdominal hernia with mesh.  Past Medical History:  Diagnosis Date   History of kidney stones     Past Surgical History:  Procedure Laterality Date   ANKLE SURGERY  2003   screws x two 2003   CYSTOSCOPY WITH HOLMIUM LASER LITHOTRIPSY Left    age 78   EXTRACORPOREAL SHOCK WAVE LITHOTRIPSY Left 2012   x2    History reviewed. No pertinent family history.  Social:  reports that he quit smoking about 14 years ago. His smoking use included cigarettes. He has never used smokeless tobacco. He reports that he does not drink alcohol and does not use drugs.  Allergies: No Known Allergies  Medications: Current Outpatient Medications  Medication Instructions   acetaminophen (TYLENOL) 650 mg, Oral, Every 6 hours PRN   docusate sodium (COLACE) 100 mg, Oral, 2 times daily   indapamide (LOZOL) 2.5 mg, Oral, Every morning   methocarbamol (ROBAXIN) 500 mg, Oral, Every 8 hours PRN   oxyCODONE (OXY IR/ROXICODONE) 5 mg, Oral, Every 6 hours PRN   polyethylene glycol (MIRALAX / GLYCOLAX) 17 g, Oral, Every 12 hours PRN    ROS - all of the below systems have been reviewed with the patient and positives are indicated with bold text General: chills, fever or night sweats Eyes: blurry vision or double vision ENT: epistaxis or sore throat Allergy/Immunology: itchy/watery eyes or nasal congestion Hematologic/Lymphatic: bleeding problems, blood clots or swollen lymph nodes Endocrine: temperature intolerance or unexpected weight changes Breast: new or changing breast lumps or nipple discharge Resp: cough, shortness of breath, or wheezing CV: chest pain or dyspnea on exertion GI: as per HPI GU: dysuria, trouble voiding, or hematuria MSK: joint pain or joint stiffness Neuro: TIA or stroke  symptoms Derm: pruritus and skin lesion changes Psych: anxiety and depression  Objective   PE Blood pressure (!) 136/94, pulse 82, temperature (!) 97.4 F (36.3 C), temperature source Oral, resp. rate 16, height 6\' 1"  (1.854 m), weight 79.8 kg, SpO2 100 %. Constitutional: NAD; conversant; no deformities Eyes: Moist conjunctiva; no lid lag; anicteric; PERRL Neck: Trachea midline; no thyromegaly Lungs: Normal respiratory effort; no tactile fremitus CV: RRR; no palpable thrills; no pitting edema GI: Abd palpable weakness in the left abdominal wall over the iliac spine, difficult to feel fascial defects; no palpable hepatosplenomegaly MSK: Normal range of motion of extremities; no clubbing/cyanosis Psychiatric: Appropriate affect; alert and oriented x3 Lymphatic: No palpable cervical or axillary lymphadenopathy  No results found for this or any previous visit (from the past 24 hour(s)).  Imaging:  CT Abd/Pel 05/31/20  1. Retraction of the lateral oblique muscle off the left iliac crest is identified with surrounding hematoma. Findings are consistent with avulsion of the left lateral oblique musculature off the left iliac crest. 2. Small volume of hemoperitoneum is identified within the pericolic gutters bilaterally. 3. Signs of focal sigmoid colon contusion with small volume of adjacent hemorrhagewithin the sigmoid mesentery. No signs of luminal compromise. No pneumoperitoneum identified to suggest bowel perforation. Small volume of hemoperitoneum identified along the pericolic gutters bilaterally. 4. Bilateral nephrolithiasis. 5. Aortic atherosclerosis.  Assessment and Plan   Joseph Patel is an 40 y.o. male with a left traumatic abdominal wall hernia.  I recommended open left lateral abdominal wall hernia repair with mesh.  The procedure itself as well as its risk, benefits, and alternatives were discussed the patient in full who granted consent to proceed.  The risk discussed  included but not limited to the risk of infection, bleeding, damage nearby structures, mesh complication, recurrent hernia.  After full discussion all questions answered the patient granted consent to proceed.  We will proceed to the operating room as scheduled.    Quentin Ore, MD  [facility] Use [email or web address] to contact on call provider

## 2020-11-05 NOTE — Op Note (Addendum)
Patient: Joseph Patel (1980/10/01, [id number])  Date of Surgery: 11/05/2020   Preoperative Diagnosis: LEFT TRAUMATIC ABDOMINAL WALL HERNIA    Postoperative Diagnosis: LEFT TRAUMATIC ABDOMINAL WALL HERNIA    Surgical Procedure: LEFT TRAUMATIC  ABDOMINAL WALL HERNIA REPAIR WITH MESH  Operative Team Members:  Surgeon(s) and Role:    * Guy Seese, Hyman Hopes, MD - Primary   Susa Day, MD - [facility] Resident Assistant  I was present for the critical and key portions of the surgery and I was immediately available throughout the entire procedure.    Anesthesiologist: Trevor Iha, MD CRNA: Florene Route, CRNA; Williford, Peggy D, CRNA   Anesthesia: General   Fluids:  Total I/O In: 1000 [I.V.:1000] Out: 100 [Blood:100]  Complications: None  Drains:  none   Specimen: Nond  Disposition:  PACU - hemodynamically stable.  Plan of Care: Admit to inpatient   Indications for Procedure: Joseph Patel is a 40 y.o. male who presented with a left abdominal traumatic abdominal wall hernia.  I recommended open repair.  The procedure itself as well as its risks, benefits and alternatives were discussed and the patient granted consent to proceed.  Findings:  Hernia Location: Left flank Hernia Size:  3 tall x 5 cm wide  Mesh Size &Type:  15 cm wide x 15 cm tall Bard Soft Mesh Mesh Position: preperitoneal  Infection status: Patient: Private Patient Elective Case Case: Elective Infection Present At Time Of Surgery (PATOS): None    Description of Procedure:  The patient was positioned lateral and flexed on the operating room table on a bean bag, adequately padded and secured.  A timeout procedure was performed.  An oblique incision was made and dissection was carried down through subcutaneous tissue. The abdomen was entered safely.  There were no adhesions.  A meticulous and tedious sharp lysis of adhesions was carried out.  This was completed with no injury to the viscera.  After  the anterior abdominal wall was cleared off of all adhesions, a large safety towel was placed over the viscera to protect them.  The hernia defect was located in the left flank regions. The total hernia defect area was measured as 5 cm horizontal and 3 cm vertical.   The preperitoneal space was entered. A wide preperitoneal dissection was carried out medial to the midline, cephalad until the costal margin was visualized. The peritoneum was then peeled off of the underside of the costal margin on the diaphragm. Laterally the peritoneum was mobilized, rolling the visceral sac medially and exposing the quadratus lumborum muscle and the psoas muscle. The iliohypogastric and ilioinguinal nerves were identified coursing within the retroperitoneal plane and preserved. Caudal the dissection continued onto the iliac wing and the visceral sac was rolled off of the iliacus muscle.   After creation of this wide preperitoneal pocket, the area was inspected for hemostasis.     A piece of Bard soft 15cm x 15 cm mesh was brought into the operative field and sized to 15 cm x 15 cm.  The mesh was deployed into the preperitoneal space so that it conformed to the visceral sac. The mesh extended cephalad to the costal margin , medially to the midline abdomen, laterally the mesh conformed to the quadratus lumborum and psoas muscles, and inferiorly the mesh lay along the iliacus muscle.  The oblique muscles were reconstructed overtop the mesh utilizing a continuous 0 Stratafix suture.   The fascia came together well. The subcutaneous tissue was irrigated with the  antibiotic irrigation solution. The subcuticular tissue was reapproximated with buried, interrupted 2-0 Vicryl suture.  The skin was closed with a 4-0 Monocryl subcuticular suture and Dermabond skin glue.    Ivar Drape, MD General, Bariatric, & Minimally Invasive Surgery [facility], [place]

## 2020-11-05 NOTE — Transfer of Care (Signed)
Immediate Anesthesia Transfer of Care Note  Patient: NEIMAN ROOTS  Procedure(s) Performed: LEFT TRAUMATIC HERNIA REPAIR WITH MESH (Left: Abdomen)  Patient Location: PACU  Anesthesia Type:General  Level of Consciousness: awake, alert  and oriented  Airway & Oxygen Therapy: Patient Spontanous Breathing and Patient connected to face mask oxygen  Post-op Assessment: Report given to RN and Post -op Vital signs reviewed and stable  Post vital signs: Reviewed and stable  Last Vitals:  Vitals Value Taken Time  BP    Temp    Pulse 100 11/05/20 1540  Resp 18 11/05/20 1540  SpO2 100 % 11/05/20 1540  Vitals shown include unvalidated device data.  Last Pain:  Vitals:   11/05/20 1241  TempSrc: Oral  PainSc:          Complications: No notable events documented.

## 2020-11-05 NOTE — Anesthesia Procedure Notes (Addendum)
Procedure Name: Intubation Date/Time: 11/05/2020 1:30 PM Performed by: Alexxander Kurt D, CRNA Pre-anesthesia Checklist: Patient identified, Emergency Drugs available, Suction available and Patient being monitored Patient Re-evaluated:Patient Re-evaluated prior to induction Oxygen Delivery Method: Circle system utilized Preoxygenation: Pre-oxygenation with 100% oxygen Induction Type: IV induction Ventilation: Mask ventilation without difficulty Laryngoscope Size: Mac and 4 Grade View: Grade I Tube type: Oral Tube size: 7.5 mm Number of attempts: 1 Airway Equipment and Method: Stylet Placement Confirmation: ETT inserted through vocal cords under direct vision, positive ETCO2 and breath sounds checked- equal and bilateral Secured at: 22 cm Tube secured with: Tape Dental Injury: Teeth and Oropharynx as per pre-operative assessment

## 2020-11-06 ENCOUNTER — Other Ambulatory Visit (HOSPITAL_COMMUNITY): Payer: Self-pay

## 2020-11-06 ENCOUNTER — Encounter (HOSPITAL_COMMUNITY): Payer: Self-pay | Admitting: Surgery

## 2020-11-06 LAB — CBC
HCT: 40.3 % (ref 39.0–52.0)
Hemoglobin: 13.9 g/dL (ref 13.0–17.0)
MCH: 31.4 pg (ref 26.0–34.0)
MCHC: 34.5 g/dL (ref 30.0–36.0)
MCV: 91 fL (ref 80.0–100.0)
Platelets: 230 10*3/uL (ref 150–400)
RBC: 4.43 MIL/uL (ref 4.22–5.81)
RDW: 12.7 % (ref 11.5–15.5)
WBC: 14.8 10*3/uL — ABNORMAL HIGH (ref 4.0–10.5)
nRBC: 0 % (ref 0.0–0.2)

## 2020-11-06 LAB — BASIC METABOLIC PANEL
Anion gap: 9 (ref 5–15)
BUN: 13 mg/dL (ref 6–20)
CO2: 28 mmol/L (ref 22–32)
Calcium: 9 mg/dL (ref 8.9–10.3)
Chloride: 101 mmol/L (ref 98–111)
Creatinine, Ser: 1.05 mg/dL (ref 0.61–1.24)
GFR, Estimated: >60 mL/min (ref >60)
Glucose, Bld: 161 mg/dL — ABNORMAL HIGH (ref 70–99)
Potassium: 3.7 mmol/L (ref 3.5–5.1)
Sodium: 138 mmol/L (ref 135–145)

## 2020-11-06 MED ORDER — METHOCARBAMOL 500 MG PO TABS
500.0000 mg | ORAL_TABLET | Freq: Four times a day (QID) | ORAL | 0 refills | Status: AC
  Filled 2020-11-06: qty 56, 14d supply, fill #0

## 2020-11-06 MED ORDER — OXYCODONE-ACETAMINOPHEN 5-325 MG PO TABS
1.0000 | ORAL_TABLET | ORAL | 0 refills | Status: AC | PRN
  Filled 2020-11-06: qty 20, 4d supply, fill #0

## 2020-11-06 MED ORDER — IBUPROFEN 800 MG PO TABS
800.0000 mg | ORAL_TABLET | Freq: Three times a day (TID) | ORAL | 0 refills | Status: AC
  Filled 2020-11-06: qty 21, 7d supply, fill #0

## 2020-11-06 MED ORDER — ACETAMINOPHEN 500 MG PO TABS
500.0000 mg | ORAL_TABLET | Freq: Four times a day (QID) | ORAL | 0 refills | Status: AC | PRN
  Filled 2020-11-06: qty 50, 13d supply, fill #0

## 2020-11-06 NOTE — Discharge Instructions (Addendum)
VENTRAL HERNIA REPAIR POST OPERATIVE INSTRUCTIONS  Check out the [facility] Abdominal Core Health Rehabilitation Plan: [email or web address]  Thinking Clearly  The anesthesia may cause you to feel different for 1 or 2 days. Do not drive, drink alcohol, or make any big decisions for at least 2 days.  Nutrition When you wake up, you will be able to drink small amounts of liquid. If you do not feel sick, you can slowly advance your diet to regular foods. Continue to drink lots of fluids, usually about 8 to 10 glasses per day. Eat a high-fiber diet so you don't strain during bowel movements. High-Fiber Foods Foods high in fiber include beans, bran cereals and whole-grain breads, peas, dried fruit (figs, apricots, and dates), raspberries, blackberries, strawberries, sweet corn, broccoli, baked potatoes with skin, plums, pears, apples, greens, and nuts. Activity Slowly increase your activity. Be sure to get up and walk every hour or so to prevent blood clots. No heavy lifting or strenuous activity for 4 weeks following surgery to prevent hernias at your incision sites or recurrence of your hernia. It is normal to feel tired. You may need more sleep than usual.  Get your rest but make sure to get up and move around frequently to prevent blood clots and pneumonia.  Work and Return to [employer] can go back to work when you feel well enough. Discuss the timing with your surgeon. You can usually go back to school or work 1 week or less after an laparoscopic or an open repair. If your work requires heavy lifting or strenuous activity you need to be placed on light duty for 4 weeks following surgery. You can return to gym class, sports or other physical activities 4 weeks after surgery.  Wound Care You may experience significant bruising throughout the abdominal wall that may track down into the groin  including into the scrotum in males.  Rest, elevating the groin and scrotum above the level of the heart, ice and compression with tight fitting underwear or an abdominal binder can help.  Always wash your hands before and after touching near your incision site. Do not soak in a bathtub until cleared at your follow up appointment. You may take a shower 24 hours after surgery. A small amount of drainage from the incision is normal. If the drainage is thick and yellow or the site is red, you may have an infection, so call your surgeon. If you have a drain in one of your incisions, it will be taken out in office when the drainage stops. Steri-Strips will fall off in 7 to 10 days or they will be removed during your first office visit. If you have dermabond glue covering over the incision, allow the glue to flake off on its own. Protect the new skin, especially from the sun. The sun can burn and cause darker scarring. Your scar will heal in about 4 to 6 weeks and will become softer and continue to fade over the next year.  The cosmetic appearance of the incisions will improve over the course of the first year after surgery. Sensation around your incision will return in a few weeks or months.  Bowel Movements After intestinal surgery, you may have loose watery stools for several days. If watery diarrhea lasts longer than 3 days, contact your surgeon. Pain medication (narcotics) can cause constipation. Increase the fiber in your diet with high-fiber foods if you are constipated. You can take an over the counter stool softener like Colace to avoid  constipation.  Additional over the counter medications can also be used if Colace isn't sufficient (for example, Milk of Magnesia or Miralax).  Pain The amount of pain is different for each person. Some people need only 1 to 3 doses of pain control medication, while others need more. Take alternating doses of tylenol and ibuprofen around the clock for the first  five days following surgery.  This will provide a baseline of pain control and help with inflammation.  Take the narcotic pain medication in addition if needed for severe pain.  Contact Your Surgeon at [phone], if you have: Pain that will not go away Pain that gets worse A fever of more than 101F (38.3C) Repeated vomiting Swelling, redness, bleeding, or bad-smelling drainage from your wound site Strong abdominal pain No bowel movement or unable to pass gas for 3 days Watery diarrhea lasting longer than 3 days  Pain Control The goal of pain control is to minimize pain, keep you moving and help you heal. Your surgical team will work with you on your pain plan. Most often a combination of therapies and medications are used to control your pain. You may also be given medication (local anesthetic) at the surgical site. This may help control your pain for several days. Extreme pain puts extra stress on your body at a time when your body needs to focus on healing. Do not wait until your pain has reached a level "10" or is unbearable before telling your doctor or nurse. It is much easier to control pain before it becomes severe. Following a laparoscopic procedure, pain is sometimes felt in the shoulder. This is due to the gas inserted into your abdomen during the procedure. Moving and walking helps to decrease the gas and the right shoulder pain.  Use the guide below for ways to manage your post-operative pain. Learn more by going to facs.org/safepaincontrol.  How Intense Is My Pain Common Therapies to Feel Better       I hardly notice my pain, and it does not interfere with my activities.  I notice my pain and it distracts me, but I can still do activities (sitting up, walking, standing).  Non-Medication Therapies  Ice (in a bag, applied over clothing at the surgical site), elevation, rest, meditation, massage, distraction (music, TV, play) walking and mild exercise Splinting the  abdomen with pillows +  Non-Opioid Medications Acetaminophen (Tylenol) Non-steroidal anti-inflammatory drugs (NSAIDS) Aspirin, Ibuprofen (Motrin, Advil) Naproxen (Aleve) Take these as needed, when you feel pain. Both acetaminophen and NSAIDs help to decrease pain and swelling (inflammation).      My pain is hard to ignore and is more noticeable even when I rest.  My pain interferes with my usual activities.  Non-Medication Therapies  +  Non-Opioid medications  Take on a regular schedule (around-the-clock) instead of as needed. (For example, Tylenol every 6 hours at 9:00 am, 3:00 pm, 9:00 pm, 3:00 am and Motrin every 6 hours at 12:00 am, 6:00 am, 12:00 pm, 6:00 pm)         I am focused on my pain, and I am not doing my daily activities.  I am groaning in pain, and I cannot sleep. I am unable to do anything.  My pain is as bad as it could be, and nothing else matters.  Non-Medication Therapies  +  Around-the-Clock Non-Opioid Medications  +  Short-acting opioids  Opioids should be used with other medications to manage severe pain. Opioids block pain and give a feeling of  euphoria (feel high). Addiction, a serious side effect of opioids, is rare with short-term (a few days) use.  Examples of short-acting opioids include: Tramadol (Ultram), Hydrocodone (Norco, Vicodin), Hydromorphone (Dilaudid), Oxycodone (Oxycontin)     The above directions have been adapted from the [employer] of [employer] Surgical Patient Education Program.  Please refer to the ACS website if needed: https://www.mckee-gibson.com/.ashx   Ivar Drape, MD [facility] [address], [place], [place]  [address] ?  [address] [address], [place], [place]   [address] [phone] ? [phone] ? FAX [phone] Web site: [email or web address]

## 2020-11-06 NOTE — Plan of Care (Signed)
All discharge medications were given to the Pt and his spouse. All questions were answered.

## 2020-11-06 NOTE — Discharge Summary (Signed)
  Patient ID: Joseph Patel [id number] 40 y.o. 1980-05-21  11/05/2020  Discharge date and time: 11/06/2020  Admitting Physician: Hyman Hopes Royalty Domagala  Discharge Physician: Hyman Hopes Evelette Hollern  Admission Diagnoses: Lateral ventral hernia [K43.9] Patient Active Problem List   Diagnosis Date Noted   Lateral ventral hernia 11/05/2020   Traumatic abdominal hernia 05/31/2020   Renal calculus, left 10/22/2011     Discharge Diagnoses: Left lateral ventral hernia secondary to trauma Patient Active Problem List   Diagnosis Date Noted   Lateral ventral hernia 11/05/2020   Traumatic abdominal hernia 05/31/2020   Renal calculus, left 10/22/2011    Operations: Procedure(s): LEFT TRAUMATIC HERNIA REPAIR WITH MESH  Admission Condition: good  Discharged Condition: good  Indication for Admission: left lateral abdominal wall hernia  Hospital Course: Mr. Creasy presented for elective repair of a lateral abdominal wall hernia.  He recovered well and was discharged the following day.  Consults: None  Significant Diagnostic Studies: None  Treatments: surgery: as above  Disposition: Home  Patient Instructions:  Allergies as of 11/06/2020   No Known Allergies      Medication List     STOP taking these medications    docusate sodium 100 MG capsule Commonly known as: COLACE   oxyCODONE 5 MG immediate release tablet Commonly known as: Oxy IR/ROXICODONE   polyethylene glycol 17 g packet Commonly known as: MIRALAX / GLYCOLAX       TAKE these medications    acetaminophen 325 MG tablet Commonly known as: TYLENOL Take 650 mg by mouth every 6 (six) hours as needed for headache or moderate pain. What changed: Another medication with the same name was added. Make sure you understand how and when to take each.   acetaminophen 500 MG tablet Commonly known as: TYLENOL Take 1 tablet (500 mg total) by mouth every 6 (six) hours as needed. What changed: You were already taking a  medication with the same name, and this prescription was added. Make sure you understand how and when to take each.   ibuprofen 800 MG tablet Commonly known as: ADVIL Take 1 tablet by mouth every 8  hours for 7 days.   indapamide 2.5 MG tablet Commonly known as: LOZOL Take 2.5 mg by mouth in the morning.   methocarbamol 500 MG tablet Commonly known as: Robaxin Take 1 tablet by mouth 4  times daily for 14 days. What changed:  when to take this reasons to take this   oxyCODONE-acetaminophen 5-325 MG tablet Commonly known as: Percocet Take 1 tablet by mouth every 4 hours as needed for severe pain.        Activity: no heavy lifting for 4 weeks Diet: regular diet Wound Care: keep wound clean and dry  Follow-up:  With Dr. Dossie Der in 4 weeks.  Signed: Hyman Hopes Cheria Sadiq General, Bariatric, & Minimally Invasive Surgery [facility], [place]   11/06/2020, 7:37 AM

## 2021-01-21 ENCOUNTER — Other Ambulatory Visit (HOSPITAL_COMMUNITY): Payer: Self-pay

## 2021-06-04 ENCOUNTER — Other Ambulatory Visit: Payer: Self-pay

## 2021-07-17 ENCOUNTER — Other Ambulatory Visit: Payer: Self-pay
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
